# Patient Record
Sex: Male | Born: 1955 | Race: White | Hispanic: No | Marital: Married | Smoking: Never smoker
Health system: Southern US, Community
[De-identification: ages and names within clinical notes are randomized; demographics above are authoritative.]

## PROBLEM LIST (undated history)

## (undated) DIAGNOSIS — K219 Gastro-esophageal reflux disease without esophagitis: Secondary | ICD-10-CM

## (undated) DIAGNOSIS — Z87442 Personal history of urinary calculi: Secondary | ICD-10-CM

## (undated) DIAGNOSIS — I249 Acute ischemic heart disease, unspecified: Secondary | ICD-10-CM

## (undated) DIAGNOSIS — I10 Essential (primary) hypertension: Secondary | ICD-10-CM

## (undated) HISTORY — PX: VASCULAR SURGERY: SHX849

---

## 2017-06-05 ENCOUNTER — Observation Stay (HOSPITAL_COMMUNITY): Payer: BLUE CROSS/BLUE SHIELD

## 2017-06-05 ENCOUNTER — Observation Stay: Admission: AD | Admit: 2017-06-05 | Payer: Self-pay | Source: Ambulatory Visit | Admitting: Cardiovascular Disease

## 2017-06-05 ENCOUNTER — Observation Stay (HOSPITAL_COMMUNITY)
Admission: AD | Admit: 2017-06-05 | Discharge: 2017-06-07 | Disposition: A | Discharge status: Home / Self Care | Payer: BLUE CROSS/BLUE SHIELD | Source: Ambulatory Visit | Attending: Cardiovascular Disease | Admitting: Cardiovascular Disease

## 2017-06-05 DIAGNOSIS — Z8249 Family history of ischemic heart disease and other diseases of the circulatory system: Secondary | ICD-10-CM | POA: Diagnosis not present

## 2017-06-05 DIAGNOSIS — R079 Chest pain, unspecified: Secondary | ICD-10-CM

## 2017-06-05 DIAGNOSIS — I25110 Atherosclerotic heart disease of native coronary artery with unstable angina pectoris: Principal | ICD-10-CM | POA: Insufficient documentation

## 2017-06-05 DIAGNOSIS — I10 Essential (primary) hypertension: Secondary | ICD-10-CM | POA: Insufficient documentation

## 2017-06-05 DIAGNOSIS — Z6830 Body mass index (BMI) 30.0-30.9, adult: Secondary | ICD-10-CM | POA: Diagnosis not present

## 2017-06-05 DIAGNOSIS — Z882 Allergy status to sulfonamides status: Secondary | ICD-10-CM | POA: Diagnosis not present

## 2017-06-05 DIAGNOSIS — I249 Acute ischemic heart disease, unspecified: Secondary | ICD-10-CM | POA: Diagnosis present

## 2017-06-05 DIAGNOSIS — Z79899 Other long term (current) drug therapy: Secondary | ICD-10-CM | POA: Insufficient documentation

## 2017-06-05 DIAGNOSIS — E669 Obesity, unspecified: Secondary | ICD-10-CM | POA: Diagnosis not present

## 2017-06-05 DIAGNOSIS — E785 Hyperlipidemia, unspecified: Secondary | ICD-10-CM | POA: Insufficient documentation

## 2017-06-05 DIAGNOSIS — Z7982 Long term (current) use of aspirin: Secondary | ICD-10-CM | POA: Insufficient documentation

## 2017-06-05 DIAGNOSIS — K219 Gastro-esophageal reflux disease without esophagitis: Secondary | ICD-10-CM | POA: Diagnosis present

## 2017-06-05 HISTORY — DX: Acute ischemic heart disease, unspecified: I24.9

## 2017-06-05 HISTORY — DX: Essential (primary) hypertension: I10

## 2017-06-05 HISTORY — DX: Personal history of urinary calculi: Z87.442

## 2017-06-05 HISTORY — DX: Gastro-esophageal reflux disease without esophagitis: K21.9

## 2017-06-05 LAB — COMPREHENSIVE METABOLIC PANEL
ALK PHOS: 64 U/L (ref 38–126)
ALT: 30 U/L (ref 17–63)
AST: 28 U/L (ref 15–41)
Albumin: 4 g/dL (ref 3.5–5.0)
Anion gap: 15 (ref 5–15)
BILIRUBIN TOTAL: 1.1 mg/dL (ref 0.3–1.2)
BUN: 19 mg/dL (ref 6–20)
CALCIUM: 9.6 mg/dL (ref 8.9–10.3)
CO2: 22 mmol/L (ref 22–32)
CREATININE: 1.27 mg/dL — AB (ref 0.61–1.24)
Chloride: 103 mmol/L (ref 101–111)
GFR calc Af Amer: >60 mL/min (ref >60)
GFR, EST NON AFRICAN AMERICAN: 59 mL/min — AB (ref >60)
Glucose, Bld: 84 mg/dL (ref 65–99)
POTASSIUM: 3.8 mmol/L (ref 3.5–5.1)
Sodium: 140 mmol/L (ref 135–145)
TOTAL PROTEIN: 7.2 g/dL (ref 6.5–8.1)

## 2017-06-05 LAB — CBC WITH DIFFERENTIAL/PLATELET
Basophils Absolute: 0 10*3/uL (ref 0.0–0.1)
Basophils Relative: 0 %
EOS PCT: 3 %
Eosinophils Absolute: 0.2 10*3/uL (ref 0.0–0.7)
HCT: 51.2 % (ref 39.0–52.0)
Hemoglobin: 17.8 g/dL — ABNORMAL HIGH (ref 13.0–17.0)
LYMPHS ABS: 1.9 10*3/uL (ref 0.7–4.0)
LYMPHS PCT: 30 %
MCH: 34.8 pg — AB (ref 26.0–34.0)
MCHC: 34.8 g/dL (ref 30.0–36.0)
MCV: 100 fL (ref 78.0–100.0)
MONO ABS: 0.7 10*3/uL (ref 0.1–1.0)
MONOS PCT: 11 %
Neutro Abs: 3.4 10*3/uL (ref 1.7–7.7)
Neutrophils Relative %: 56 %
PLATELETS: 193 10*3/uL (ref 150–400)
RBC: 5.12 MIL/uL (ref 4.22–5.81)
RDW: 12.3 % (ref 11.5–15.5)
WBC: 6.2 10*3/uL (ref 4.0–10.5)

## 2017-06-05 LAB — APTT: APTT: 29 s (ref 24–36)

## 2017-06-05 LAB — TROPONIN I

## 2017-06-05 LAB — PROTIME-INR
INR: 1.01
Prothrombin Time: 13.2 seconds (ref 11.4–15.2)

## 2017-06-05 IMAGING — DX DG CHEST 2V
2 series · 2 of 2 positions shown · non-contrast
Comparison: None.

CLINICAL DATA: Chest pain

EXAM:
CHEST - 2 VIEW

[chest pa]
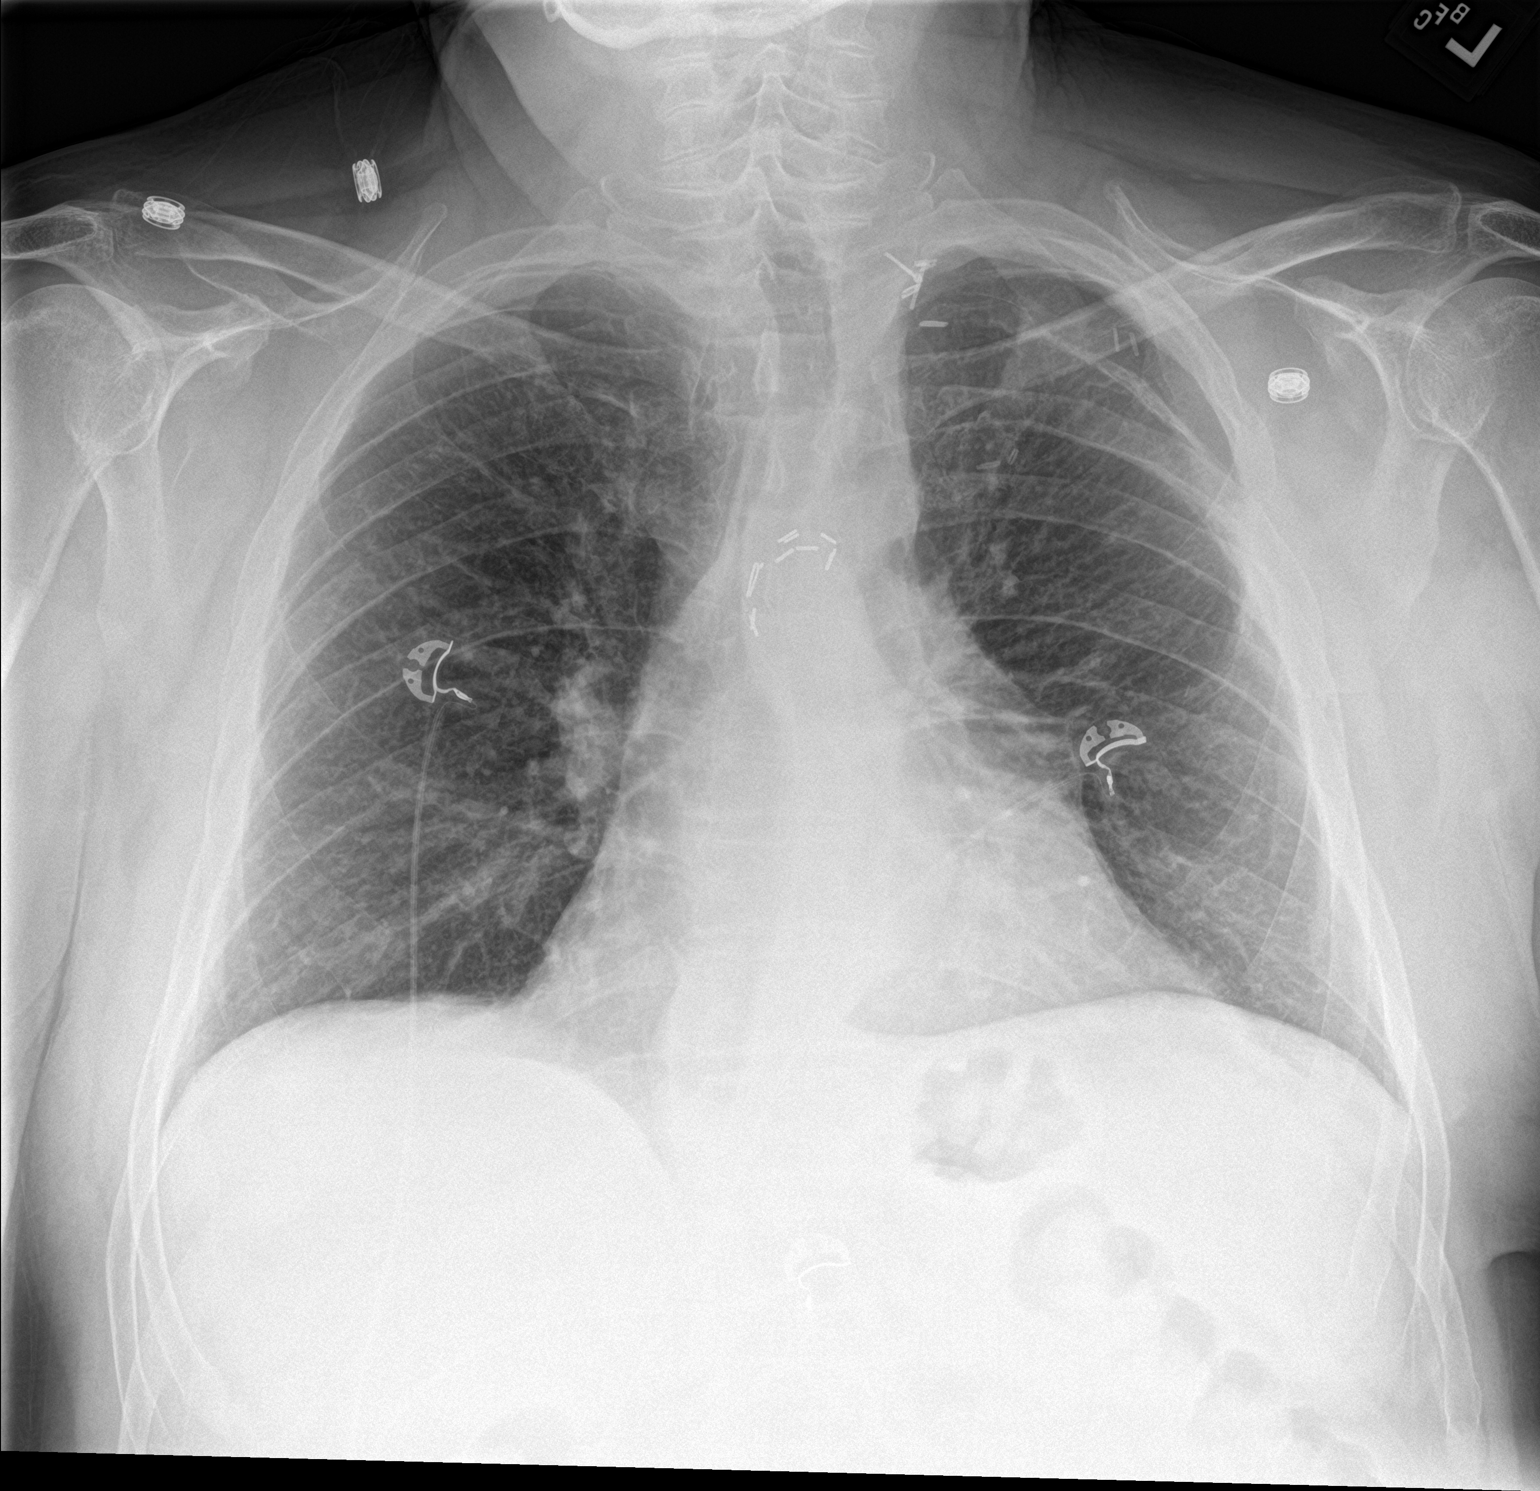

[chest lat]
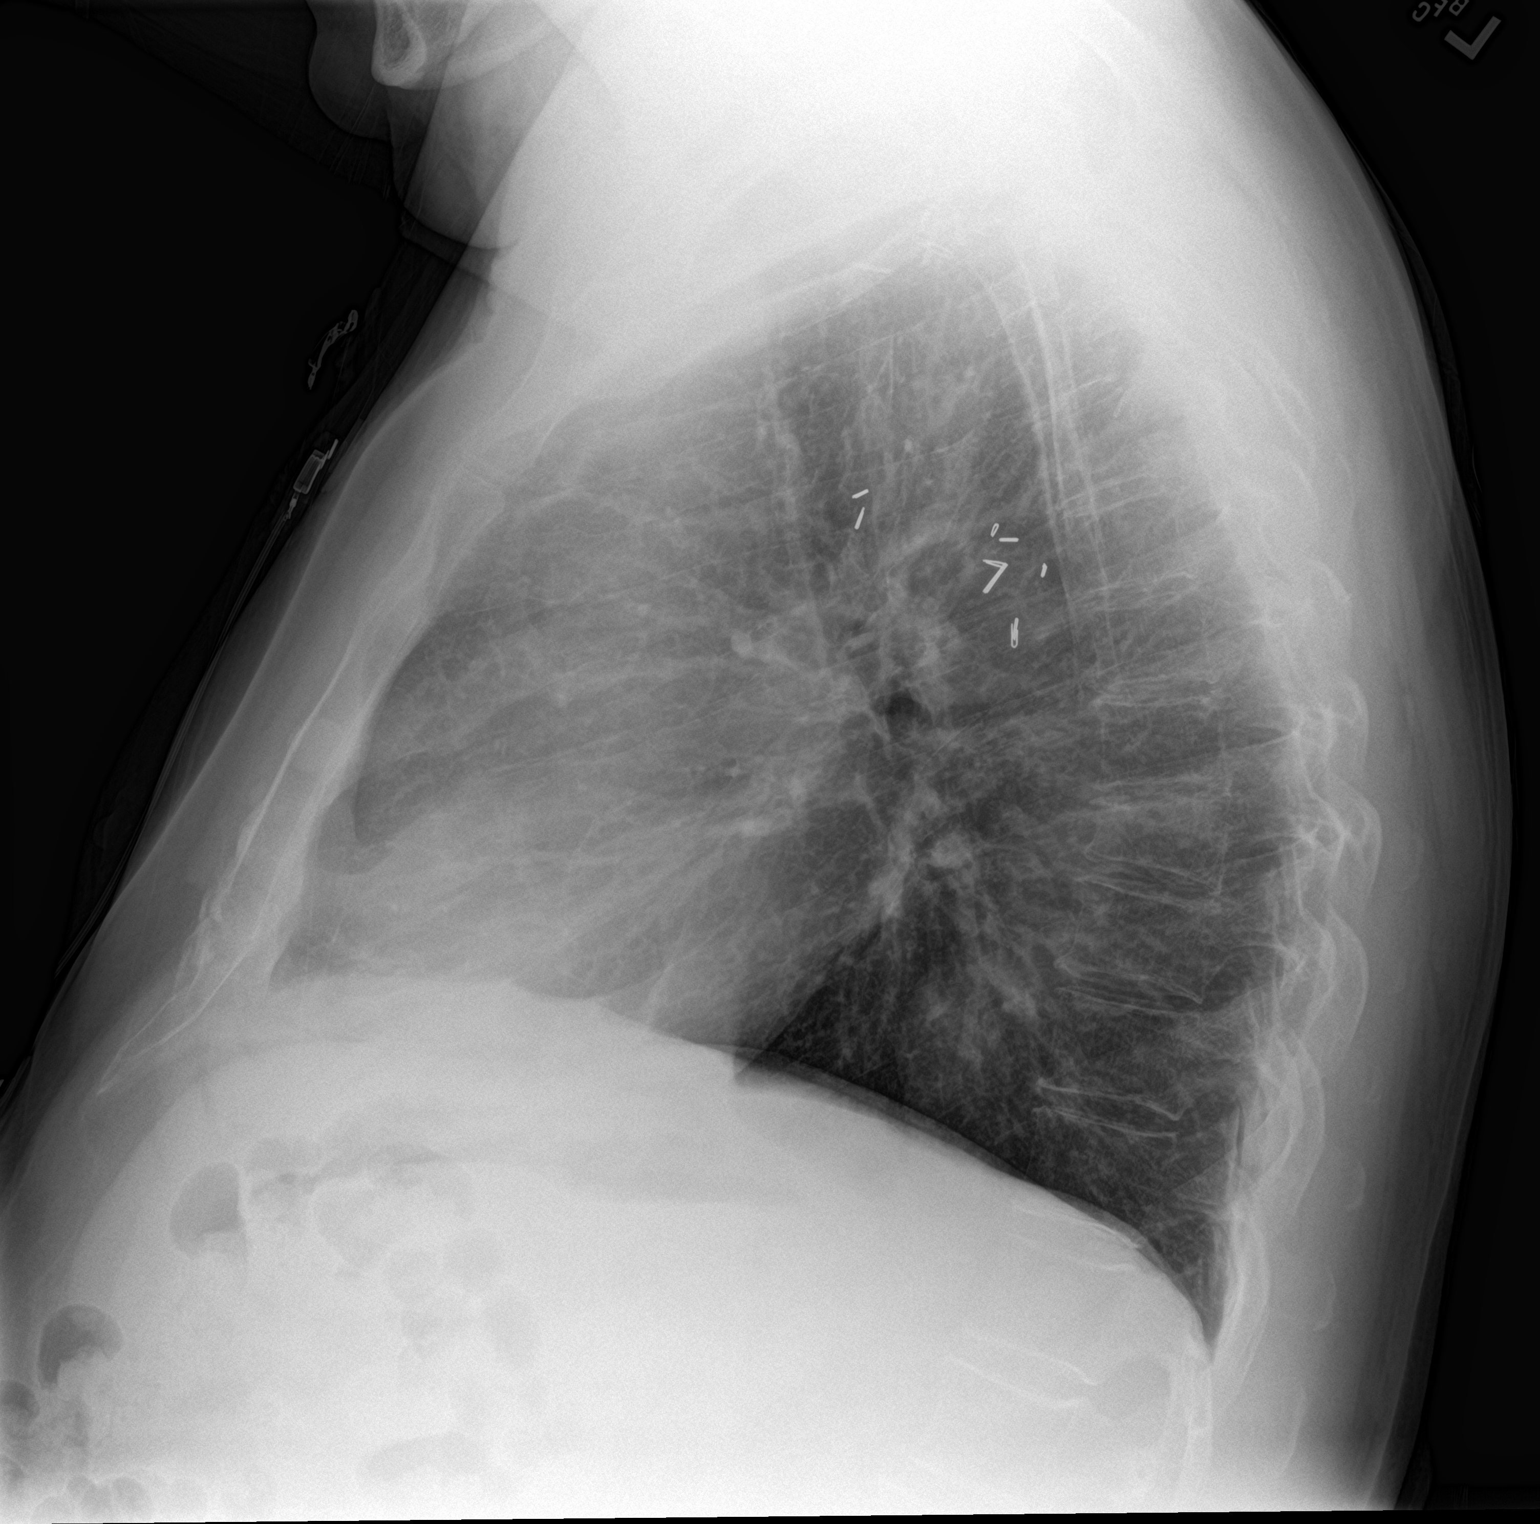

[2 of 2 positions shown; findings below may reference images not displayed]

FINDINGS: Mild hyperinflation. No acute opacity. Normal heart size. Surgical
clips in the mediastinum. Chronic appearing left second and third
rib deformities. Stellate appearance at the left hilus on the
frontal view.
IMPRESSION: No active cardiopulmonary disease. Postoperative changes of the
chest. Stellate opacity at the left hilar region possibly due to
prior surgery or treatment change but no specific history of this is
given nor are there prior radiographs for comparison. Follow-up CT
chest as indicated

## 2017-06-05 MED ORDER — ALPRAZOLAM 0.25 MG PO TABS
0.2500 mg | ORAL_TABLET | Freq: Two times a day (BID) | ORAL | Status: DC | PRN
  Administered 2017-06-05 – 2017-06-07 (×3): 0.25 mg via ORAL
  Filled 2017-06-05 (×3): qty 1

## 2017-06-05 MED ORDER — AMLODIPINE BESYLATE 10 MG PO TABS
10.0000 mg | ORAL_TABLET | Freq: Every day | ORAL | Status: DC
  Administered 2017-06-06 – 2017-06-07 (×2): 10 mg via ORAL
  Filled 2017-06-05 (×3): qty 1

## 2017-06-05 MED ORDER — HYDROCHLOROTHIAZIDE 12.5 MG PO CAPS
12.5000 mg | ORAL_CAPSULE | Freq: Every day | ORAL | Status: DC
  Administered 2017-06-06 – 2017-06-07 (×2): 12.5 mg via ORAL
  Filled 2017-06-05 (×3): qty 1

## 2017-06-05 MED ORDER — NITROGLYCERIN 0.4 MG SL SUBL: 0.4000 mg | SUBLINGUAL_TABLET | SUBLINGUAL | Status: DC | PRN

## 2017-06-05 MED ORDER — BENAZEPRIL HCL 10 MG PO TABS
40.0000 mg | ORAL_TABLET | Freq: Every day | ORAL | Status: DC
  Administered 2017-06-06 – 2017-06-07 (×2): 40 mg via ORAL
  Filled 2017-06-05 (×3): qty 4

## 2017-06-05 MED ORDER — ONDANSETRON HCL 4 MG/2ML IJ SOLN: 4.0000 mg | Freq: Four times a day (QID) | INTRAMUSCULAR | Status: DC | PRN

## 2017-06-05 MED ORDER — ATORVASTATIN CALCIUM 10 MG PO TABS
10.0000 mg | ORAL_TABLET | Freq: Every day | ORAL | Status: DC
  Administered 2017-06-06 – 2017-06-07 (×2): 10 mg via ORAL
  Filled 2017-06-05 (×2): qty 1

## 2017-06-05 MED ORDER — HEPARIN (PORCINE) IN NACL 100-0.45 UNIT/ML-% IJ SOLN
1200.0000 [IU]/h | INTRAMUSCULAR | Status: DC
  Administered 2017-06-05 – 2017-06-06 (×2): 1000 [IU]/h via INTRAVENOUS
  Filled 2017-06-05 (×2): qty 250

## 2017-06-05 MED ORDER — ACETAMINOPHEN 325 MG PO TABS: 650.0000 mg | ORAL_TABLET | ORAL | Status: DC | PRN

## 2017-06-05 MED ORDER — ASPIRIN EC 81 MG PO TBEC
81.0000 mg | DELAYED_RELEASE_TABLET | Freq: Every day | ORAL | Status: DC
  Administered 2017-06-06 – 2017-06-07 (×2): 81 mg via ORAL
  Filled 2017-06-05 (×2): qty 1

## 2017-06-05 MED ORDER — AMLODIPINE BESY-BENAZEPRIL HCL 10-40 MG PO CAPS: 1.0000 | ORAL_CAPSULE | Freq: Every day | ORAL | Status: DC

## 2017-06-05 MED ORDER — HEPARIN BOLUS VIA INFUSION
4000.0000 [IU] | Freq: Once | INTRAVENOUS | Status: AC
  Administered 2017-06-05: 4000 [IU] via INTRAVENOUS
  Filled 2017-06-05: qty 4000

## 2017-06-05 NOTE — H&P (Signed)
Referring Physician:  [name] is an 62 y.o. male.                       Chief Complaint: Chest pain  HPI: 62 year old male has PMH of hypertension, obesity and hyperlipidemia. He has recurrent chest pain. EKG in office showed sinus tachycardia with LBBB. He also has heartburn. He has right arm sharp pain x 1 day. 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  Past medical history : No DM, II, + HTN, Positive smoking, Positive alcohol use but decreased use in last 6 months. No drug use. + hyperlipidemia. + FH of CAD with father dying of MI at age 64 years.   Family history: Mom died of MI age 88 years. Dad died of MI age 20 years.  Social History:  has no tobacco, alcohol, and drug history on file.  Allergies:  Allergies  Allergen Reactions  . Sulfa Antibiotics Rash    Medications Prior to Admission  Medication Sig Dispense Refill  . amLODipine-benazepril (LOTREL) 10-40 MG capsule Take 1 capsule by mouth daily.    Marland Kitchen aspirin 325 MG tablet Take 1,300 mg by mouth daily.    Marland Kitchen atorvastatin (LIPITOR) 10 MG tablet Take 10 mg by mouth daily.    . hydrochlorothiazide (MICROZIDE) 12.5 MG capsule Take 12.5 mg by mouth daily.      Results for orders placed or performed during the hospital encounter of 06/05/17 (from the past 48 hour(s))  Comprehensive metabolic panel     Status: Abnormal   Collection Time: 06/05/17  6:21 PM  Result Value Ref Range   Sodium 140 135 - 145 mmol/L   Potassium 3.8 3.5 - 5.1 mmol/L   Chloride 103 101 - 111 mmol/L   CO2 22 22 - 32 mmol/L   Glucose, Bld 84 65 - 99 mg/dL   BUN 19 6 - 20 mg/dL   Creatinine, Ser 1.27 (H) 0.61 - 1.24 mg/dL   Calcium 9.6 8.9 - 10.3 mg/dL   Total Protein 7.2 6.5 - 8.1 g/dL   Albumin 4.0 3.5 - 5.0 g/dL   AST 28 15 - 41 U/L   ALT 30 17 - 63 U/L   Alkaline Phosphatase 64 38 - 126 U/L   Total Bilirubin 1.1 0.3 - 1.2 mg/dL   GFR calc non Af Amer 59 (L) >60 mL/min   GFR calc Af Amer >60 >60 mL/min    Comment:  (NOTE) The eGFR has been calculated using the CKD EPI equation. This calculation has not been validated in all clinical situations. eGFR's persistently <60 mL/min signify possible Chronic Kidney Disease.    Anion gap 15 5 - 15    Comment: Performed at [facility] [address], [place], [place] [address]  Troponin I     Status: None   Collection Time: 06/05/17  6:21 PM  Result Value Ref Range   Troponin I <0.03 <0.03 ng/mL    Comment: Performed at [facility] [address] [place], [place] [address]  Protime-INR     Status: None   Collection Time: 06/05/17  6:21 PM  Result Value Ref Range   Prothrombin Time 13.2 11.4 - 15.2 seconds   INR 1.01     Comment: Performed at [facility] [address], [place], [place] [address]  CBC WITH DIFFERENTIAL     Status: Abnormal   Collection Time: 06/05/17  6:21 PM  Result  Value Ref Range   WBC 6.2 4.0 - 10.5 K/uL   RBC 5.12 4.22 - 5.81 MIL/uL   Hemoglobin 17.8 (H) 13.0 - 17.0 g/dL   HCT 51.2 39.0 - 52.0 %   MCV 100.0 78.0 - 100.0 fL   MCH 34.8 (H) 26.0 - 34.0 pg   MCHC 34.8 30.0 - 36.0 g/dL   RDW 12.3 11.5 - 15.5 %   Platelets 193 150 - 400 K/uL   Neutrophils Relative % 56 %   Neutro Abs 3.4 1.7 - 7.7 K/uL   Lymphocytes Relative 30 %   Lymphs Abs 1.9 0.7 - 4.0 K/uL   Monocytes Relative 11 %   Monocytes Absolute 0.7 0.1 - 1.0 K/uL   Eosinophils Relative 3 %   Eosinophils Absolute 0.2 0.0 - 0.7 K/uL   Basophils Relative 0 %   Basophils Absolute 0.0 0.0 - 0.1 K/uL    Comment: Performed at [facility] [address] [place], [place] [address]  APTT     Status: None   Collection Time: 06/05/17  6:21 PM  Result Value Ref Range   aPTT 29 24 - 36 seconds    Comment: Performed at [facility] [address], [place], [place] [address]   No results found.  Review Of Systems Constitutional: No fever, chills, weight loss or gain. Eyes: No vision change, wears glasses. No discharge or pain. Ears:  No hearing loss, No tinnitus. Respiratory: No asthma, COPD, pneumonias. Positive shortness of breath. No hemoptysis. Cardiovascular: Positive chest pain, palpitation, leg edema. Gastrointestinal: No nausea, vomiting, diarrhea, constipation. No GI bleed. No hepatitis. Positive heartburn.  Genitourinary: No dysuria, hematuria, kidney stone. No incontinance. Neurological: No headache, stroke, seizures.  Psychiatry: No psych facility admission for anxiety, depression, suicide. No detox. Skin: No rash. Musculoskeletal: Positive joint pain, no fibromyalgia. No neck pain, positive back pain. Lymphadenopathy: No lymphadenopathy. Hematology: No anemia or easy bruising.   Blood pressure (!) 144/74, pulse 89, temperature 99 F (37.2 C), temperature source Oral, resp. rate 20, height _0  (1.702 m), weight 104 kg (229 lb 4.8 oz), SpO2 94 %. Body mass index is 35.91 kg/m. General appearance: alert, cooperative, appears stated age and no distress Head: Normocephalic, atraumatic. Eyes: Blue eyes, pink conjunctiva, corneas clear. PERRL, EOM's intact. Neck: No adenopathy, no carotid bruit, no JVD, supple, symmetrical, trachea midline and thyroid not enlarged. Resp: Clear to auscultation bilaterally. Cardio: Regular rate and rhythm, S1, S2 normal, II/VI systolic murmur, no click, rub or gallop GI: Soft, non-tender; bowel sounds normal; no organomegaly. Extremities: 1 + edema, cyanosis or clubbing. Skin: Warm and dry.  Neurologic: Alert and oriented X 3, normal strength. Normal coordination and gait.  Assessment/Plan Acute coronary syndrome R/O MI Hypertension Obesity Hyperlipidemia  Troponin-I x 3.  IV heparin. Nuclear stress test in AM.  Birdie Riddle, MD  06/05/2017, 9:42 PM

## 2017-06-05 NOTE — Progress Notes (Signed)
ANTICOAGULATION CONSULT NOTE - Initial Consult  Pharmacy Consult for heparin Indication: chest pain/ACS  Allergies not on file  Patient Measurements: Height: 5\' 7"  (170.2 cm) Weight: 229 lb 4.8 oz (104 kg) IBW/kg (Calculated) : 66.1 Heparin Dosing Weight: 89kg  Vital Signs: Temp: 99 F (37.2 C) (03/27 1744) Temp Source: Oral (03/27 1744) BP: 144/74 (03/27 1744) Pulse Rate: 89 (03/27 1744)  Labs: No results for input(s): HGB, HCT, PLT, APTT, LABPROT, INR, HEPARINUNFRC, HEPRLOWMOCWT, CREATININE, CKTOTAL, CKMB, TROPONINI in the last 72 hours.  CrCl cannot be calculated (No order found.).   Medical History: No past medical history on file.  Assessment: 62 year old male with no prior cardiac history admitted for nuclear stress test in am for possible ACS. Patient does not take any blood thinners prior to admit but does say he take 4 full dose aspirin daily and has been taking them for years.   Goal of Therapy:  Heparin level 0.3-0.7 units/ml Monitor platelets by anticoagulation protocol: Yes   Plan:  Give  units bolus x 1 Start heparin infusion at 4000 units/hr Check anti-Xa level in 1000 hours and daily while on heparin Continue to monitor H&H and platelets  Sheppard CoilFrank Wilson PharmD., BCPS Clinical Pharmacist 06/05/2017 6:12 PM

## 2017-06-06 ENCOUNTER — Encounter (HOSPITAL_COMMUNITY): Payer: Self-pay | Admitting: General Practice

## 2017-06-06 ENCOUNTER — Other Ambulatory Visit: Payer: Self-pay

## 2017-06-06 ENCOUNTER — Observation Stay (HOSPITAL_COMMUNITY): Payer: BLUE CROSS/BLUE SHIELD

## 2017-06-06 DIAGNOSIS — I10 Essential (primary) hypertension: Secondary | ICD-10-CM | POA: Diagnosis not present

## 2017-06-06 DIAGNOSIS — I25110 Atherosclerotic heart disease of native coronary artery with unstable angina pectoris: Secondary | ICD-10-CM | POA: Diagnosis not present

## 2017-06-06 DIAGNOSIS — I200 Unstable angina: Secondary | ICD-10-CM | POA: Diagnosis not present

## 2017-06-06 DIAGNOSIS — K219 Gastro-esophageal reflux disease without esophagitis: Secondary | ICD-10-CM | POA: Diagnosis not present

## 2017-06-06 DIAGNOSIS — E669 Obesity, unspecified: Secondary | ICD-10-CM | POA: Diagnosis not present

## 2017-06-06 LAB — LIPID PANEL
CHOL/HDL RATIO: 2.6 ratio
CHOLESTEROL: 140 mg/dL (ref 0–200)
HDL: 53 mg/dL (ref >40)
LDL Cholesterol: 69 mg/dL (ref 0–99)
TRIGLYCERIDES: 90 mg/dL (ref <150)
VLDL: 18 mg/dL (ref 0–40)

## 2017-06-06 LAB — BASIC METABOLIC PANEL
ANION GAP: 10 (ref 5–15)
BUN: 16 mg/dL (ref 6–20)
CALCIUM: 9.5 mg/dL (ref 8.9–10.3)
CO2: 27 mmol/L (ref 22–32)
Chloride: 103 mmol/L (ref 101–111)
Creatinine, Ser: 1.29 mg/dL — ABNORMAL HIGH (ref 0.61–1.24)
GFR, EST NON AFRICAN AMERICAN: 58 mL/min — AB (ref >60)
Glucose, Bld: 98 mg/dL (ref 65–99)
POTASSIUM: 4.1 mmol/L (ref 3.5–5.1)
SODIUM: 140 mmol/L (ref 135–145)

## 2017-06-06 LAB — CBC
HCT: 52 % (ref 39.0–52.0)
Hemoglobin: 17.4 g/dL — ABNORMAL HIGH (ref 13.0–17.0)
MCH: 33.6 pg (ref 26.0–34.0)
MCHC: 33.5 g/dL (ref 30.0–36.0)
MCV: 100.4 fL — AB (ref 78.0–100.0)
PLATELETS: 168 10*3/uL (ref 150–400)
RBC: 5.18 MIL/uL (ref 4.22–5.81)
RDW: 12 % (ref 11.5–15.5)
WBC: 5.8 10*3/uL (ref 4.0–10.5)

## 2017-06-06 LAB — HEPARIN LEVEL (UNFRACTIONATED)
Heparin Unfractionated: 0.33 IU/mL (ref 0.30–0.70)
Heparin Unfractionated: 0.3 IU/mL (ref 0.30–0.70)

## 2017-06-06 LAB — TROPONIN I

## 2017-06-06 LAB — HIV ANTIBODY (ROUTINE TESTING W REFLEX): HIV Screen 4th Generation wRfx: NONREACTIVE

## 2017-06-06 MED ORDER — TECHNETIUM TC 99M TETROFOSMIN IV KIT
10.0000 | PACK | Freq: Once | INTRAVENOUS | Status: AC | PRN
  Administered 2017-06-06: 10 via INTRAVENOUS

## 2017-06-06 MED ORDER — SODIUM CHLORIDE 0.9% FLUSH: 3.0000 mL | INTRAVENOUS | Status: DC | PRN

## 2017-06-06 MED ORDER — REGADENOSON 0.4 MG/5ML IV SOLN
0.4000 mg | Freq: Once | INTRAVENOUS | Status: AC
  Administered 2017-06-06: 0.4 mg via INTRAVENOUS
  Filled 2017-06-06: qty 5

## 2017-06-06 MED ORDER — SODIUM CHLORIDE 0.9 % IV SOLN
INTRAVENOUS | Status: DC
  Administered 2017-06-07: 04:00:00 via INTRAVENOUS

## 2017-06-06 MED ORDER — SODIUM CHLORIDE 0.9 % IV SOLN
INTRAVENOUS | Status: DC
  Administered 2017-06-06: 14:00:00 via INTRAVENOUS

## 2017-06-06 MED ORDER — REGADENOSON 0.4 MG/5ML IV SOLN
INTRAVENOUS | Status: AC
  Filled 2017-06-06: qty 5

## 2017-06-06 MED ORDER — SODIUM CHLORIDE 0.9 % IV SOLN: 250.0000 mL | INTRAVENOUS | Status: DC | PRN

## 2017-06-06 MED ORDER — SODIUM CHLORIDE 0.9% FLUSH
3.0000 mL | Freq: Two times a day (BID) | INTRAVENOUS | Status: DC
  Administered 2017-06-06: 3 mL via INTRAVENOUS

## 2017-06-06 MED ORDER — TECHNETIUM TC 99M TETROFOSMIN IV KIT
30.0000 | PACK | Freq: Once | INTRAVENOUS | Status: AC | PRN
  Administered 2017-06-06: 30 via INTRAVENOUS

## 2017-06-06 NOTE — Progress Notes (Signed)
ANTICOAGULATION CONSULT NOTE - Follow Up Consult  Pharmacy Consult for heparin Indication: chest pain/ACS  Labs: Recent Labs    06/05/17 1821 06/06/17 0103 06/06/17 0612  HGB 17.8*  --  17.4*  HCT 51.2  --  52.0  PLT 193  --  168  APTT 29  --   --   LABPROT 13.2  --   --   INR 1.01  --   --   HEPARINUNFRC  --  0.33 0.30  CREATININE 1.27*  --  1.29*  TROPONINI <0.03 <0.03 <0.03    Assessment/Plan:  62yo male therapeutic on heparin for chest pain CBC stable  Continue heparin at 1000 units / hr Follow up AM labs  Thank you Okey RegalLisa Urania Pearlman, PharmD [phone]  06/06/2017,8:18 AM

## 2017-06-06 NOTE — Plan of Care (Signed)
  Problem: Clinical Measurements: Goal: Will remain free from infection Outcome: Progressing Note:  No s/s of infection noted. Goal: Respiratory complications will improve Outcome: Progressing Note:  No s/s of respiratory complications.   

## 2017-06-06 NOTE — Progress Notes (Signed)
Ref: System, Pcp Not In   Subjective:  Nuclear stress test with intermediate risk and moderate area of infarction with peri-infarct ischemia. Patient not aware of MI in past. VS stable. Creatinine is near normal.  Objective:  Vital Signs in the last 24 hours: Temp:  [97.8 F (36.6 C)-98.6 F (37 C)] 98.6 F (37 C) (03/28 1354) Pulse Rate:  [63-110] 70 (03/28 1354) Cardiac Rhythm: Normal sinus rhythm (03/28 1000) Resp:  [18] 18 (03/28 0645) BP: (116-164)/(61-102) 123/70 (03/28 1354) SpO2:  [95 %-97 %] 96 % (03/28 1354) Weight:  [102.4 kg (225 lb 12.8 oz)] 102.4 kg (225 lb 12.8 oz) (03/28 0645)  Physical Exam: BP Readings from Last 1 Encounters:  06/06/17 123/70     Wt Readings from Last 1 Encounters:  06/06/17 102.4 kg (225 lb 12.8 oz)    Weight change:  Body mass index is 35.37 kg/m. HEENT: [place]/AT, Eyes-Blue, PERL, EOMI, Conjunctiva-Pink, Sclera-Non-icteric Neck: No JVD, No bruit, Trachea midline. Lungs:  Clear, Bilateral. Cardiac:  Regular rhythm, normal S1 and S2, no S3. II/VI systolic murmur. Abdomen:  Soft, non-tender. BS present. Extremities:  No edema present. No cyanosis. No clubbing. CNS: AxOx3, Cranial nerves grossly intact, moves all 4 extremities.  Skin: Warm and dry.   Intake/Output from previous day: 03/27 0701 - 03/28 0700 In: 153.3 [I.V.:153.3] Out: -     Lab Results: BMET    Component Value Date/Time   NA 140 06/06/2017 0612   NA 140 06/05/2017 1821   K 4.1 06/06/2017 0612   K 3.8 06/05/2017 1821   CL 103 06/06/2017 0612   CL 103 06/05/2017 1821   CO2 27 06/06/2017 0612   CO2 22 06/05/2017 1821   GLUCOSE 98 06/06/2017 0612   GLUCOSE 84 06/05/2017 1821   BUN 16 06/06/2017 0612   BUN 19 06/05/2017 1821   CREATININE 1.29 (H) 06/06/2017 0612   CREATININE 1.27 (H) 06/05/2017 1821   CALCIUM 9.5 06/06/2017 0612   CALCIUM 9.6 06/05/2017 1821   GFRNONAA 58 (L) 06/06/2017 0612   GFRNONAA 59 (L) 06/05/2017 1821   GFRAA >60 06/06/2017 0612   GFRAA >60 06/05/2017 1821   CBC    Component Value Date/Time   WBC 5.8 06/06/2017 0612   RBC 5.18 06/06/2017 0612   HGB 17.4 (H) 06/06/2017 0612   HCT 52.0 06/06/2017 0612   PLT 168 06/06/2017 0612   MCV 100.4 (H) 06/06/2017 0612   MCH 33.6 06/06/2017 0612   MCHC 33.5 06/06/2017 0612   RDW 12.0 06/06/2017 0612   LYMPHSABS 1.9 06/05/2017 1821   MONOABS 0.7 06/05/2017 1821   EOSABS 0.2 06/05/2017 1821   BASOSABS 0.0 06/05/2017 1821   HEPATIC Function Panel Recent Labs    06/05/17 1821  PROT 7.2   HEMOGLOBIN A1C No components found for: HGA1C,  MPG CARDIAC ENZYMES Lab Results  Component Value Date   TROPONINI <0.03 06/06/2017   TROPONINI <0.03 06/06/2017   TROPONINI <0.03 06/05/2017   BNP No results for input(s): PROBNP in the last 8760 hours. TSH No results for input(s): TSH in the last 8760 hours. CHOLESTEROL Recent Labs    06/06/17 0103  CHOL 140    Scheduled Meds: . benazepril  40 mg Oral Daily   And  . amLODipine  10 mg Oral Daily  . aspirin EC  81 mg Oral Daily  . atorvastatin  10 mg Oral Daily  . hydrochlorothiazide  12.5 mg Oral Daily  . regadenoson       Continuous Infusions: .  sodium chloride 75 mL/hr at 06/06/17 1357  . heparin 1,000 Units/hr (06/06/17 1535)   PRN Meds:.acetaminophen, ALPRAZolam, nitroGLYCERIN, ondansetron (ZOFRAN) IV  Assessment/Plan: Unstable angina Hypertension Obesity Hyperlipidemia H/O descending aortic repair  IV saline hydration. Cardiac cath in AM. Patient understood procedure, risks and alternatives.    LOS: 0 days    Orpah Cobb  MD  06/06/2017, 6:34 PM

## 2017-06-06 NOTE — H&P (View-Only) (Signed)
Ref: System, Pcp Not In   Subjective:  Nuclear stress test with intermediate risk and moderate area of infarction with peri-infarct ischemia. Patient not aware of MI in past. VS stable. Creatinine is near normal.  Objective:  Vital Signs in the last 24 hours: Temp:  [97.8 F (36.6 C)-98.6 F (37 C)] 98.6 F (37 C) (03/28 1354) Pulse Rate:  [63-110] 70 (03/28 1354) Cardiac Rhythm: Normal sinus rhythm (03/28 1000) Resp:  [18] 18 (03/28 0645) BP: (116-164)/(61-102) 123/70 (03/28 1354) SpO2:  [95 %-97 %] 96 % (03/28 1354) Weight:  [102.4 kg (225 lb 12.8 oz)] 102.4 kg (225 lb 12.8 oz) (03/28 0645)  Physical Exam: BP Readings from Last 1 Encounters:  06/06/17 123/70     Wt Readings from Last 1 Encounters:  06/06/17 102.4 kg (225 lb 12.8 oz)    Weight change:  Body mass index is 35.37 kg/m. HEENT: [place]/AT, Eyes-Blue, PERL, EOMI, Conjunctiva-Pink, Sclera-Non-icteric Neck: No JVD, No bruit, Trachea midline. Lungs:  Clear, Bilateral. Cardiac:  Regular rhythm, normal S1 and S2, no S3. II/VI systolic murmur. Abdomen:  Soft, non-tender. BS present. Extremities:  No edema present. No cyanosis. No clubbing. CNS: AxOx3, Cranial nerves grossly intact, moves all 4 extremities.  Skin: Warm and dry.   Intake/Output from previous day: 03/27 0701 - 03/28 0700 In: 153.3 [I.V.:153.3] Out: -     Lab Results: BMET    Component Value Date/Time   NA 140 06/06/2017 0612   NA 140 06/05/2017 1821   K 4.1 06/06/2017 0612   K 3.8 06/05/2017 1821   CL 103 06/06/2017 0612   CL 103 06/05/2017 1821   CO2 27 06/06/2017 0612   CO2 22 06/05/2017 1821   GLUCOSE 98 06/06/2017 0612   GLUCOSE 84 06/05/2017 1821   BUN 16 06/06/2017 0612   BUN 19 06/05/2017 1821   CREATININE 1.29 (H) 06/06/2017 0612   CREATININE 1.27 (H) 06/05/2017 1821   CALCIUM 9.5 06/06/2017 0612   CALCIUM 9.6 06/05/2017 1821   GFRNONAA 58 (L) 06/06/2017 0612   GFRNONAA 59 (L) 06/05/2017 1821   GFRAA >60 06/06/2017 0612   GFRAA >60 06/05/2017 1821   CBC    Component Value Date/Time   WBC 5.8 06/06/2017 0612   RBC 5.18 06/06/2017 0612   HGB 17.4 (H) 06/06/2017 0612   HCT 52.0 06/06/2017 0612   PLT 168 06/06/2017 0612   MCV 100.4 (H) 06/06/2017 0612   MCH 33.6 06/06/2017 0612   MCHC 33.5 06/06/2017 0612   RDW 12.0 06/06/2017 0612   LYMPHSABS 1.9 06/05/2017 1821   MONOABS 0.7 06/05/2017 1821   EOSABS 0.2 06/05/2017 1821   BASOSABS 0.0 06/05/2017 1821   HEPATIC Function Panel Recent Labs    06/05/17 1821  PROT 7.2   HEMOGLOBIN A1C No components found for: HGA1C,  MPG CARDIAC ENZYMES Lab Results  Component Value Date   TROPONINI <0.03 06/06/2017   TROPONINI <0.03 06/06/2017   TROPONINI <0.03 06/05/2017   BNP No results for input(s): PROBNP in the last 8760 hours. TSH No results for input(s): TSH in the last 8760 hours. CHOLESTEROL Recent Labs    06/06/17 0103  CHOL 140    Scheduled Meds: . benazepril  40 mg Oral Daily   And  . amLODipine  10 mg Oral Daily  . aspirin EC  81 mg Oral Daily  . atorvastatin  10 mg Oral Daily  . hydrochlorothiazide  12.5 mg Oral Daily  . regadenoson       Continuous Infusions: .  sodium chloride 75 mL/hr at 06/06/17 1357  . heparin 1,000 Units/hr (06/06/17 1535)   PRN Meds:.acetaminophen, ALPRAZolam, nitroGLYCERIN, ondansetron (ZOFRAN) IV  Assessment/Plan: Unstable angina Hypertension Obesity Hyperlipidemia H/O descending aortic repair  IV saline hydration. Cardiac cath in AM. Patient understood procedure, risks and alternatives.    LOS: 0 days    Orpah Cobb  MD  06/06/2017, 6:34 PM

## 2017-06-06 NOTE — Progress Notes (Signed)
ANTICOAGULATION CONSULT NOTE - Follow Up Consult  Pharmacy Consult for heparin Indication: chest pain/ACS  Labs: Recent Labs    06/05/17 1821 06/06/17 0103  HGB 17.8*  --   HCT 51.2  --   PLT 193  --   APTT 29  --   LABPROT 13.2  --   INR 1.01  --   HEPARINUNFRC  --  0.33  CREATININE 1.27*  --   TROPONINI <0.03  --     Assessment/Plan:  62yo male therapeutic on heparin with initial dosing for CP. Will continue gtt at current rate and confirm stable with additional level.   Vernard GamblesVeronda Laurisa Sahakian, PharmD, BCPS  06/06/2017,1:41 AM

## 2017-06-07 ENCOUNTER — Ambulatory Visit (HOSPITAL_COMMUNITY)
Admission: AD | Disposition: A | Discharge status: Home / Self Care | Payer: Self-pay | Source: Ambulatory Visit | Attending: Cardiovascular Disease

## 2017-06-07 DIAGNOSIS — I200 Unstable angina: Secondary | ICD-10-CM | POA: Diagnosis not present

## 2017-06-07 DIAGNOSIS — I10 Essential (primary) hypertension: Secondary | ICD-10-CM | POA: Diagnosis not present

## 2017-06-07 DIAGNOSIS — I25110 Atherosclerotic heart disease of native coronary artery with unstable angina pectoris: Secondary | ICD-10-CM | POA: Diagnosis not present

## 2017-06-07 DIAGNOSIS — K219 Gastro-esophageal reflux disease without esophagitis: Secondary | ICD-10-CM | POA: Diagnosis not present

## 2017-06-07 DIAGNOSIS — E669 Obesity, unspecified: Secondary | ICD-10-CM | POA: Diagnosis not present

## 2017-06-07 HISTORY — PX: LEFT HEART CATH AND CORONARY ANGIOGRAPHY: CATH118249

## 2017-06-07 LAB — POCT ACTIVATED CLOTTING TIME: Activated Clotting Time: 114 seconds

## 2017-06-07 LAB — HEPARIN LEVEL (UNFRACTIONATED): Heparin Unfractionated: 0.18 IU/mL — ABNORMAL LOW (ref 0.30–0.70)

## 2017-06-07 LAB — CBC
HCT: 51.3 % (ref 39.0–52.0)
Hemoglobin: 17.5 g/dL — ABNORMAL HIGH (ref 13.0–17.0)
MCH: 34 pg (ref 26.0–34.0)
MCHC: 34.1 g/dL (ref 30.0–36.0)
MCV: 99.8 fL (ref 78.0–100.0)
PLATELETS: 170 10*3/uL (ref 150–400)
RBC: 5.14 MIL/uL (ref 4.22–5.81)
RDW: 11.8 % (ref 11.5–15.5)
WBC: 6.4 10*3/uL (ref 4.0–10.5)

## 2017-06-07 SURGERY — LEFT HEART CATH AND CORONARY ANGIOGRAPHY
Anesthesia: LOCAL

## 2017-06-07 MED ORDER — HEPARIN (PORCINE) IN NACL 2-0.9 UNIT/ML-% IJ SOLN
INTRAMUSCULAR | Status: AC
  Filled 2017-06-07: qty 500

## 2017-06-07 MED ORDER — FENTANYL CITRATE (PF) 100 MCG/2ML IJ SOLN
INTRAMUSCULAR | Status: DC | PRN
  Administered 2017-06-07 (×2): 25 ug via INTRAVENOUS

## 2017-06-07 MED ORDER — SODIUM CHLORIDE 0.9% FLUSH: 3.0000 mL | Freq: Two times a day (BID) | INTRAVENOUS | Status: DC

## 2017-06-07 MED ORDER — FENTANYL CITRATE (PF) 100 MCG/2ML IJ SOLN
INTRAMUSCULAR | Status: AC
  Filled 2017-06-07: qty 2

## 2017-06-07 MED ORDER — SODIUM CHLORIDE 0.9% FLUSH: 3.0000 mL | INTRAVENOUS | Status: DC | PRN

## 2017-06-07 MED ORDER — HEPARIN (PORCINE) IN NACL 2-0.9 UNIT/ML-% IJ SOLN
INTRAMUSCULAR | Status: AC | PRN
  Administered 2017-06-07 (×2): 500 mL

## 2017-06-07 MED ORDER — LIDOCAINE HCL (PF) 1 % IJ SOLN
INTRAMUSCULAR | Status: DC | PRN
  Administered 2017-06-07: 30 mL

## 2017-06-07 MED ORDER — IOPAMIDOL (ISOVUE-370) INJECTION 76%
INTRAVENOUS | Status: DC | PRN
  Administered 2017-06-07: 30 mL via INTRA_ARTERIAL

## 2017-06-07 MED ORDER — MIDAZOLAM HCL 2 MG/2ML IJ SOLN
INTRAMUSCULAR | Status: AC
  Filled 2017-06-07: qty 2

## 2017-06-07 MED ORDER — ASPIRIN 81 MG PO TBEC: 81.0000 mg | DELAYED_RELEASE_TABLET | Freq: Every day | ORAL | Status: AC

## 2017-06-07 MED ORDER — LIDOCAINE HCL 1 % IJ SOLN
INTRAMUSCULAR | Status: AC
  Filled 2017-06-07: qty 20

## 2017-06-07 MED ORDER — CLOPIDOGREL BISULFATE 75 MG PO TABS: 75.0000 mg | ORAL_TABLET | Freq: Every day | ORAL | 3 refills | Status: AC

## 2017-06-07 MED ORDER — SODIUM CHLORIDE 0.9 % IV SOLN: INTRAVENOUS | Status: AC

## 2017-06-07 MED ORDER — MIDAZOLAM HCL 2 MG/2ML IJ SOLN
INTRAMUSCULAR | Status: DC | PRN
  Administered 2017-06-07 (×2): 1 mg via INTRAVENOUS

## 2017-06-07 MED ORDER — IOPAMIDOL (ISOVUE-370) INJECTION 76%
INTRAVENOUS | Status: AC
  Filled 2017-06-07: qty 100

## 2017-06-07 MED ORDER — SODIUM CHLORIDE 0.9 % IV SOLN: 250.0000 mL | INTRAVENOUS | Status: DC | PRN

## 2017-06-07 MED ORDER — CLOPIDOGREL BISULFATE 75 MG PO TABS
75.0000 mg | ORAL_TABLET | Freq: Every day | ORAL | Status: DC
Start: 2017-06-08 — End: 2017-06-07

## 2017-06-07 MED ORDER — NITROGLYCERIN 0.4 MG SL SUBL: 0.4000 mg | SUBLINGUAL_TABLET | SUBLINGUAL | 1 refills | Status: AC | PRN

## 2017-06-07 SURGICAL SUPPLY — 8 items
CATH INFINITI 5FR MULTPACK ANG (CATHETERS) ×2
KIT HEART LEFT (KITS) ×2
PACK CARDIAC CATHETERIZATION (CUSTOM PROCEDURE TRAY) ×2
SHEATH AVANTI 11CM 5FR (SHEATH) ×2
TRANSDUCER W/STOPCOCK (MISCELLANEOUS) ×2
WIRE AQUATRAK .035X150 ANG (WIRE) ×2
WIRE EMERALD 3MM-J .035X150CM (WIRE) ×2
WIRE EMERALD 3MM-J .035X260CM (WIRE) ×2

## 2017-06-07 NOTE — Progress Notes (Signed)
Pt ambulated for 1 hr after bedrest completed. Dr. Algie CofferKadakia came up to see patient. I reviewed d/c instructions and post cath site care to patient and his wife. No further questions. Patient stable. D/c'd via wheelchair to private vehicle

## 2017-06-07 NOTE — Discharge Instructions (Signed)

## 2017-06-07 NOTE — Progress Notes (Signed)
525fr sheath aspirated and removed from rfa. Manual pressure applied for 20 minutes. Groin level 0, tegaderm dressing applied.   Bilateral dp pulses palpable, bedrest instructions given.  Bedrest begins at 12:40:00

## 2017-06-07 NOTE — Progress Notes (Signed)
ANTICOAGULATION CONSULT NOTE - Follow Up Consult  Pharmacy Consult for heparin Indication: chest pain/ACS  Labs: Recent Labs    06/05/17 1821 06/06/17 0103 06/06/17 0612 06/07/17 0436 06/07/17 0606  HGB 17.8*  --  17.4* 17.5*  --   HCT 51.2  --  52.0 51.3  --   PLT 193  --  168 170  --   APTT 29  --   --   --   --   LABPROT 13.2  --   --   --   --   INR 1.01  --   --   --   --   HEPARINUNFRC  --  0.33 0.30  --  0.18*  CREATININE 1.27*  --  1.29*  --   --   TROPONINI <0.03 <0.03 <0.03  --   --     Assessment/Plan:  62yo male on heparin for chest pain CBC stable Heparin level low this AM  Heparin to 1200 units / hr Follow up after cath  Thank you Okey RegalLisa Aryannah Mohon, PharmD [phone]  06/07/2017,9:06 AM

## 2017-06-07 NOTE — Interval H&P Note (Signed)
History and Physical Interval Note:  06/07/2017 11:12 AM  Ranae PalmsSamuel E Dallaire  has presented today for surgery, with the diagnosis of abn stress  cp  The various methods of treatment have been discussed with the patient and family. After consideration of risks, benefits and other options for treatment, the patient has consented to  Procedure(s): LEFT HEART CATH AND CORONARY ANGIOGRAPHY (N/A) as a surgical intervention .  The patient's history has been reviewed, patient examined, no change in status, stable for surgery.  I have reviewed the patient's chart and labs.  Questions were answered to the patient's satisfaction.     Ricki RodriguezAjay S Aniket Paye

## 2017-06-07 NOTE — Discharge Summary (Signed)
Physician Discharge Summary  Patient ID: Patrick Rojas MRN: [id number] DOB/AGE: [birth year] 62 y.o.  Admit date: 06/05/2017 Discharge date: 06/07/2017  Admission Diagnoses: Acute coronary syndrome R/O MI Hypertension Obesity Hyperlipidemia  Discharge Diagnoses:  Principal Problem:   Unstable angina Active Problems:   Multi-vessel, native vessel CAD   Obesity (BMI 30-39.9)   GERD (gastroesophageal reflux disease)   Essential hypertension   S/P Aortic surgery  Discharged Condition: good  Hospital Course: 62 year old male has recurrent chest pain. His Troponin-I were negative x 3. He underwent NM myocardial perfusion stress test showing moderate area of apical fixed defect with peri-infarct ischemia. He underwent cardiac catheterization showing Mild LCx and RCA disease and moderate proximal to mid LAD disease. He will undergo CT scan of chest on OP basis in [place] or [place] area for lack of follow up on his aorta post surgery many years ago and evaluation of left hilar opacity.  He will add CO-Q-10 100 mg. daily for myalgia with Atorvastatin(Lipitor) use. He will use Plavix for 3 months. He will work on reducing weight, use low salt diet and go off HCTZ when blood pressure is low. He had post cardiac cath care instructions. He will see his Primary care doctor in 1 week and see me in 1 week.  Consults: cardiology  Significant Diagnostic Studies: labs: Near normal except elevated Hgb of 17.4 to 17.8 g/dl. Normal BMET except Creatinine of 1.27 mg. Normal Lipid panel.   EKG-NSR, Left axis deviation and LBBB.  Nuclear stress test: Moderate area of apical fixed defect with peri-infarct ischemia. EF 53 %.  Cardiac cath showed Minimal to mild RCA, LCx and moderate Proximal LAD disease after origin of 1st. Diagonal vessel. Pre procedure saline hydration and only 30 cc of dye was used for coronary angiography to preserve renal function.  Treatments: cardiac meds: amlodipine, Aspirin,  Plavix, Atorvastatin and Benazepril.  Discharge Exam: Blood pressure 130/77, pulse (!) 57, temperature 98.2 F (36.8 C), temperature source Oral, resp. rate 16, height 5\' 7"  (1.702 m), weight 103.1 kg (227 lb 6.4 oz), SpO2 99 %. General appearance: alert, cooperative and appears stated age. Head: Normocephalic, atraumatic. Eyes: Blue eyes, pink conjunctiva, corneas clear. PERRL, EOM's intact.  Neck: No adenopathy, no carotid bruit, no JVD, supple, symmetrical, trachea midline and thyroid not enlarged. Resp: Clear to auscultation bilaterally. Cardio: Regular rate and rhythm, S1, S2 normal, II/VI systolic murmur, no click, rub or gallop. GI: Soft, non-tender; bowel sounds normal; no organomegaly. Extremities: No edema, cyanosis or clubbing. No hematoma. Skin: Warm and dry.  Neurologic: Alert and oriented X 3, normal strength and tone. Normal coordination and gait.  Disposition: Discharge disposition: 01-Home or Self Care        Allergies as of 06/07/2017      Reactions   Sulfa Antibiotics Rash      Medication List    STOP taking these medications   aspirin 325 MG tablet Replaced by:  aspirin 81 MG EC tablet     TAKE these medications   amLODipine-benazepril 10-40 MG capsule Commonly known as:  LOTREL Take 1 capsule by mouth daily.   aspirin 81 MG EC tablet Take 1 tablet (81 mg total) by mouth daily. Start taking on:  06/08/2017 Replaces:  aspirin 325 MG tablet   atorvastatin 10 MG tablet Commonly known as:  LIPITOR Take 10 mg by mouth daily.   clopidogrel 75 MG tablet Commonly known as:  PLAVIX Take 1 tablet (75 mg total) by mouth daily.  hydrochlorothiazide 12.5 MG capsule Commonly known as:  MICROZIDE Take 12.5 mg by mouth daily.   nitroGLYCERIN 0.4 MG SL tablet Commonly known as:  NITROSTAT Place 1 tablet (0.4 mg total) under the tongue every 5 (five) minutes x 3 doses as needed for chest pain.      Follow-up Information    Orpah CobbKadakia, Borden Thune, MD. Schedule  an appointment as soon as possible for a visit in 1 week(s).   Specialty:  Cardiology Contact information: [address] [place] [address] [phone]           Signed: Ricki Rodriguezjay S Marget Outten 06/07/2017, 5:36 PM

## 2017-06-10 ENCOUNTER — Encounter (HOSPITAL_COMMUNITY): Payer: Self-pay | Admitting: Cardiovascular Disease

## 2017-06-10 MED FILL — Lidocaine HCl Local Inj 1%: INTRAMUSCULAR | Qty: 20 | Status: AC

## 2017-06-10 MED FILL — Heparin Sodium (Porcine) 2 Unit/ML in Sodium Chloride 0.9%: INTRAMUSCULAR | Qty: 1000 | Status: AC
